# Patient Record
Sex: Female | Born: 1980 | Race: Asian | Hispanic: Yes | Marital: Single | Smoking: Never smoker
Health system: Southern US, Community
[De-identification: ages and names within clinical notes are randomized; demographics above are authoritative.]

## PROBLEM LIST (undated history)

## (undated) DIAGNOSIS — Z789 Other specified health status: Secondary | ICD-10-CM

## (undated) HISTORY — PX: TOOTH EXTRACTION: SUR596

## (undated) HISTORY — PX: DILATION AND CURETTAGE OF UTERUS: SHX78

---

## 2015-12-23 ENCOUNTER — Encounter (HOSPITAL_COMMUNITY): Payer: Self-pay | Admitting: *Deleted

## 2015-12-23 DIAGNOSIS — N3000 Acute cystitis without hematuria: Secondary | ICD-10-CM | POA: Insufficient documentation

## 2015-12-23 LAB — COMPREHENSIVE METABOLIC PANEL
ALBUMIN: 4.1 g/dL (ref 3.5–5.0)
ALT: 30 U/L (ref 14–54)
AST: 27 U/L (ref 15–41)
Alkaline Phosphatase: 64 U/L (ref 38–126)
Anion gap: 10 (ref 5–15)
BILIRUBIN TOTAL: 1.1 mg/dL (ref 0.3–1.2)
BUN: 9 mg/dL (ref 6–20)
CO2: 23 mmol/L (ref 22–32)
CREATININE: 0.86 mg/dL (ref 0.44–1.00)
Calcium: 9.7 mg/dL (ref 8.9–10.3)
Chloride: 103 mmol/L (ref 101–111)
GFR calc Af Amer: >60 mL/min (ref >60)
GLUCOSE: 140 mg/dL — AB (ref 65–99)
POTASSIUM: 3.7 mmol/L (ref 3.5–5.1)
Sodium: 136 mmol/L (ref 135–145)
TOTAL PROTEIN: 7.5 g/dL (ref 6.5–8.1)

## 2015-12-23 LAB — URINALYSIS, ROUTINE W REFLEX MICROSCOPIC
GLUCOSE, UA: NEGATIVE mg/dL
KETONES UR: 15 mg/dL — AB
NITRITE: POSITIVE — AB
PH: 7 (ref 5.0–8.0)
Specific Gravity, Urine: 1.015 (ref 1.005–1.030)

## 2015-12-23 LAB — URINE MICROSCOPIC-ADD ON

## 2015-12-23 LAB — CBC
HEMATOCRIT: 40.4 % (ref 36.0–46.0)
Hemoglobin: 14.2 g/dL (ref 12.0–15.0)
MCH: 29.8 pg (ref 26.0–34.0)
MCHC: 35.1 g/dL (ref 30.0–36.0)
MCV: 84.7 fL (ref 78.0–100.0)
PLATELETS: 319 10*3/uL (ref 150–400)
RBC: 4.77 MIL/uL (ref 3.87–5.11)
RDW: 12.3 % (ref 11.5–15.5)
WBC: 15.9 10*3/uL — ABNORMAL HIGH (ref 4.0–10.5)

## 2015-12-23 LAB — POC URINE PREG, ED: Preg Test, Ur: NEGATIVE

## 2015-12-23 MED ORDER — ONDANSETRON 4 MG PO TBDP
4.0000 mg | ORAL_TABLET | Freq: Once | ORAL | Status: AC | PRN
  Administered 2015-12-23: 4 mg via ORAL

## 2015-12-23 MED ORDER — ONDANSETRON 4 MG PO TBDP
ORAL_TABLET | ORAL | Status: AC
  Filled 2015-12-23: qty 1

## 2015-12-23 NOTE — ED Triage Notes (Signed)
Pt c/o headache, dizziness, and dysuria x 1 week. Reports today pt fainted twice at home, did hit her head. Pt has hx of kidney infections and has been attempting to use AZO otc without relief.

## 2015-12-24 ENCOUNTER — Emergency Department (HOSPITAL_COMMUNITY)
Admission: EM | Admit: 2015-12-24 | Discharge: 2015-12-24 | Disposition: A | Discharge status: Home / Self Care | Payer: Self-pay | Attending: Emergency Medicine | Admitting: Emergency Medicine

## 2015-12-24 DIAGNOSIS — N3000 Acute cystitis without hematuria: Secondary | ICD-10-CM

## 2015-12-24 MED ORDER — IBUPROFEN 800 MG PO TABS
800.0000 mg | ORAL_TABLET | Freq: Once | ORAL | Status: AC
  Administered 2015-12-24: 800 mg via ORAL
  Filled 2015-12-24: qty 1

## 2015-12-24 MED ORDER — CEPHALEXIN 500 MG PO CAPS: 500.0000 mg | ORAL_CAPSULE | Freq: Four times a day (QID) | ORAL | 0 refills | Status: AC

## 2015-12-24 MED ORDER — CEPHALEXIN 250 MG PO CAPS
500.0000 mg | ORAL_CAPSULE | Freq: Once | ORAL | Status: AC
  Administered 2015-12-24: 500 mg via ORAL
  Filled 2015-12-24: qty 2

## 2015-12-24 MED ORDER — ACETAMINOPHEN 500 MG PO TABS
1000.0000 mg | ORAL_TABLET | Freq: Once | ORAL | Status: AC
  Administered 2015-12-24: 1000 mg via ORAL
  Filled 2015-12-24: qty 2

## 2015-12-24 NOTE — ED Provider Notes (Signed)
[facility]-EMERGENCY DEPT Provider Note   CSN: [id number] Arrival date & time: 12/23/15  1955  By signing my name below, I, [name], attest that this documentation has been prepared under the direction and in the presence of Melene Planan Kalinda Romaniello, DO.  Electronically Signed: [name], ED Scribe. 12/24/15. 3:44 AM.    History   Chief Complaint Chief Complaint  Patient presents with  . Abdominal Pain   The history is provided by the patient. A language interpreter was used.    HPI Comments: Rebecca OmsCristhy Bartlett is a 35 y.o. female who presents to the Emergency Department here for multiple complaints this evening. Prior to arrival, pt experienced two separate episodes of syncope this evening. Pt states first episode occurred as she was attempting to get up from her bed, walked a short distance and fainted. Second episode occurred "shortly after the first". She also reports ongoing, intermittent dizziness, HA, back pain, dysuria, and lower abdominal pain. No aggravating or alleviating factors reported. OTC AZO attempted prior to arrived without any relief. No recent nausea, vomiting, or diarrhea. Pt had a history of kidney infections and states symptoms feel similar.  PCP: No primary care provider on file.    History reviewed. No pertinent past medical history.  There are no active problems to display for this patient.   History reviewed. No pertinent surgical history.  OB History    No data available       Home Medications    Prior to Admission medications   Medication Sig Start Date End Date Taking? Authorizing Provider  cephALEXin (KEFLEX) 500 MG capsule Take 1 capsule (500 mg total) by mouth 4 (four) times daily. 12/24/15   Melene Planan Jennah Satchell, DO    Family History History reviewed. No pertinent family history.  Social History Social History  Substance Use Topics  . Smoking status: Never Smoker  . Smokeless tobacco: Never Used  . Alcohol use No     Allergies   Review of  patient's allergies indicates no known allergies.   Review of Systems Review of Systems  Constitutional: Positive for fever. Negative for chills.  HENT: Negative for congestion and rhinorrhea.   Eyes: Negative for redness and visual disturbance.  Respiratory: Negative for shortness of breath and wheezing.   Cardiovascular: Negative for chest pain and palpitations.  Gastrointestinal: Positive for abdominal pain. Negative for diarrhea, nausea and vomiting.  Genitourinary: Positive for dysuria. Negative for urgency.  Musculoskeletal: Positive for back pain. Negative for arthralgias and myalgias.  Skin: Negative for pallor and wound.  Neurological: Positive for dizziness, syncope and headaches.  All other systems reviewed and are negative.    Physical Exam Updated Vital Signs BP 105/60 (BP Location: Left Arm)   Pulse 84   Temp 98.9 F (37.2 C) (Oral)   Resp 16   LMP 12/13/2015   SpO2 98%   Physical Exam  Constitutional: She is oriented to person, place, and time. She appears well-developed and well-nourished. No distress.  HENT:  Head: Normocephalic and atraumatic.  Eyes: EOM are normal. Pupils are equal, round, and reactive to light.  Neck: Normal range of motion. Neck supple.  Cardiovascular: Normal rate and regular rhythm.  Exam reveals no gallop and no friction rub.   No murmur heard. Pulmonary/Chest: Effort normal. She has no wheezes. She has no rales.  Diffuse mild L sided abdominal tenderness.  Abdominal: Soft. She exhibits no distension. There is tenderness.  Genitourinary:  Genitourinary Comments: Mild L sided CVA tenderness.  Musculoskeletal: She exhibits  no edema or tenderness.  Neurological: She is alert and oriented to person, place, and time.  Skin: Skin is warm and dry. She is not diaphoretic.  Psychiatric: She has a normal mood and affect. Her behavior is normal.  Nursing note and vitals reviewed.    ED Treatments / Results   DIAGNOSTIC STUDIES: Oxygen  Saturation is 98% on RA, Normal by my interpretation.    COORDINATION OF CARE: 3:35 AM- Will give Zofran. Will order blood work, EKG, and urinalysis. Discussed treatment plan with pt at bedside and pt agreed to plan.     Labs (all labs ordered are listed, but only abnormal results are displayed) Labs Reviewed  COMPREHENSIVE METABOLIC PANEL - Abnormal; Notable for the following:       Result Value   Glucose, Bld 140 (*)    All other components within normal limits  CBC - Abnormal; Notable for the following:    WBC 15.9 (*)    All other components within normal limits  URINALYSIS, ROUTINE W REFLEX MICROSCOPIC (NOT AT [facility]) - Abnormal; Notable for the following:    Color, Urine ORANGE (*)    APPearance TURBID (*)    Hgb urine dipstick MODERATE (*)    Bilirubin Urine SMALL (*)    Ketones, ur 15 (*)    Protein, ur >300 (*)    Nitrite POSITIVE (*)    Leukocytes, UA LARGE (*)    All other components within normal limits  URINE MICROSCOPIC-ADD ON - Abnormal; Notable for the following:    Squamous Epithelial / LPF 0-5 (*)    Bacteria, UA MANY (*)    All other components within normal limits  POC URINE PREG, ED    EKG  EKG Interpretation  Date/Time:  Sunday December 23 2015 20:22:21 EDT Ventricular Rate:  117 PR Interval:  124 QRS Duration: 72 QT Interval:  296 QTC Calculation: 412 R Axis:   85 Text Interpretation:   Poor data quality, interpretation may be adversely affected Sinus tachycardia Nonspecific T wave abnormality Abnormal ECG flipped t wave in lead v3 Otherwise no significant change Confirmed by Karlton Maya MD, DANIEL ([id number]) on 12/24/2015 2:45:50 AM       Radiology No results found.  Procedures Procedures (including critical care time)  Medications Ordered in ED Medications  acetaminophen (TYLENOL) tablet 1,000 mg (not administered)  ibuprofen (ADVIL,MOTRIN) tablet 800 mg (not administered)  cephALEXin (KEFLEX) capsule 500 mg (not administered)  ondansetron  (ZOFRAN-ODT) disintegrating tablet 4 mg (4 mg Oral Given 12/23/15 2017)     Initial Impression / Assessment and Plan / ED Course  I have reviewed the triage vital signs and the nursing notes.  Pertinent labs & imaging results that were available during my care of the patient were reviewed by me and considered in my medical decision making (see chart for details).  Clinical Course    35 yo F With a chief complaints of diffuse abdominal pain flank pain fevers and chills. This been going on for the past few days. She tried to go to the hospital today and she passed out twice. Sounds like the patient is orthostatic based on her description. She denies any vomiting or diarrhea. She had labs that do not show a anemia. She does have a leukocytosis and a urinary tract infection. Will treat her for possible pyelonephritis with Keflex. PCP follow-up.  3:50 AM:  I have discussed the diagnosis/risks/treatment options with the patient and family and believe the pt to be eligible for discharge home  to follow-up with PCP. We also discussed returning to the ED immediately if new or worsening sx occur. We discussed the sx which are most concerning (e.g., sudden worsening pain, fever, inability to tolerate by mouth) that necessitate immediate return. Medications administered to the patient during their visit and any new prescriptions provided to the patient are listed below.  Medications given during this visit Medications  acetaminophen (TYLENOL) tablet 1,000 mg (not administered)  ibuprofen (ADVIL,MOTRIN) tablet 800 mg (not administered)  cephALEXin (KEFLEX) capsule 500 mg (not administered)  ondansetron (ZOFRAN-ODT) disintegrating tablet 4 mg (4 mg Oral Given 12/23/15 2017)     The patient appears reasonably screen and/or stabilized for discharge and I doubt any other medical condition or other [facility] requiring further screening, evaluation, or treatment in the ED at this time prior to discharge.    Final  Clinical Impressions(s) / ED Diagnoses   Final diagnoses:  Acute cystitis without hematuria    New Prescriptions New Prescriptions   CEPHALEXIN (KEFLEX) 500 MG CAPSULE    Take 1 capsule (500 mg total) by mouth 4 (four) times daily.   I personally performed the services described in this documentation, which was scribed in my presence. The recorded information has been reviewed and is accurate.     Melene Plan, DO 12/24/15 [phone]

## 2017-07-29 ENCOUNTER — Encounter (HOSPITAL_COMMUNITY): Payer: Self-pay

## 2017-07-29 ENCOUNTER — Other Ambulatory Visit (HOSPITAL_COMMUNITY): Payer: Self-pay | Admitting: Nurse Practitioner

## 2017-07-29 DIAGNOSIS — Z369 Encounter for antenatal screening, unspecified: Secondary | ICD-10-CM

## 2017-07-31 ENCOUNTER — Encounter (HOSPITAL_COMMUNITY): Payer: Self-pay

## 2017-07-31 ENCOUNTER — Other Ambulatory Visit (HOSPITAL_COMMUNITY): Payer: Self-pay | Admitting: Nurse Practitioner

## 2017-07-31 ENCOUNTER — Ambulatory Visit (HOSPITAL_COMMUNITY)
Admission: RE | Admit: 2017-07-31 | Discharge: 2017-07-31 | Disposition: A | Discharge status: Home / Self Care | Payer: Self-pay | Source: Ambulatory Visit | Attending: Nurse Practitioner | Admitting: Nurse Practitioner

## 2017-07-31 ENCOUNTER — Ambulatory Visit (HOSPITAL_COMMUNITY)
Admission: RE | Admit: 2017-07-31 | Discharge: 2017-07-31 | Disposition: A | Discharge status: Home / Self Care | Payer: Self-pay | Source: Ambulatory Visit | Attending: Obstetrics & Gynecology | Admitting: Obstetrics & Gynecology

## 2017-07-31 DIAGNOSIS — Z3A13 13 weeks gestation of pregnancy: Secondary | ICD-10-CM | POA: Insufficient documentation

## 2017-07-31 DIAGNOSIS — Z3682 Encounter for antenatal screening for nuchal translucency: Secondary | ICD-10-CM | POA: Insufficient documentation

## 2017-07-31 DIAGNOSIS — O09521 Supervision of elderly multigravida, first trimester: Secondary | ICD-10-CM | POA: Insufficient documentation

## 2017-07-31 DIAGNOSIS — Z369 Encounter for antenatal screening, unspecified: Secondary | ICD-10-CM

## 2017-07-31 NOTE — ED Notes (Signed)
[address] [place], #[id number]

## 2017-08-06 ENCOUNTER — Other Ambulatory Visit: Payer: Self-pay

## 2017-08-07 ENCOUNTER — Other Ambulatory Visit (HOSPITAL_COMMUNITY): Payer: Self-pay | Admitting: Family

## 2017-08-07 DIAGNOSIS — O09522 Supervision of elderly multigravida, second trimester: Secondary | ICD-10-CM

## 2017-08-07 DIAGNOSIS — Z3689 Encounter for other specified antenatal screening: Secondary | ICD-10-CM

## 2017-08-26 ENCOUNTER — Encounter (HOSPITAL_COMMUNITY): Payer: Self-pay

## 2017-09-04 ENCOUNTER — Ambulatory Visit (HOSPITAL_COMMUNITY)
Admission: RE | Admit: 2017-09-04 | Discharge: 2017-09-04 | Disposition: A | Discharge status: Home / Self Care | Payer: Self-pay | Source: Ambulatory Visit | Attending: Family | Admitting: Family

## 2017-09-04 ENCOUNTER — Encounter (HOSPITAL_COMMUNITY): Payer: Self-pay

## 2017-09-04 DIAGNOSIS — Z3A18 18 weeks gestation of pregnancy: Secondary | ICD-10-CM | POA: Insufficient documentation

## 2017-09-04 DIAGNOSIS — Z3689 Encounter for other specified antenatal screening: Secondary | ICD-10-CM

## 2017-09-04 DIAGNOSIS — O09522 Supervision of elderly multigravida, second trimester: Secondary | ICD-10-CM | POA: Insufficient documentation

## 2017-09-04 DIAGNOSIS — O321XX0 Maternal care for breech presentation, not applicable or unspecified: Secondary | ICD-10-CM | POA: Insufficient documentation

## 2017-09-04 DIAGNOSIS — Z36 Encounter for antenatal screening for chromosomal anomalies: Secondary | ICD-10-CM | POA: Insufficient documentation

## 2017-09-04 DIAGNOSIS — O09529 Supervision of elderly multigravida, unspecified trimester: Secondary | ICD-10-CM

## 2017-09-04 DIAGNOSIS — Z8279 Family history of other congenital malformations, deformations and chromosomal abnormalities: Secondary | ICD-10-CM | POA: Insufficient documentation

## 2017-09-04 HISTORY — DX: Other specified health status: Z78.9

## 2017-09-04 NOTE — Progress Notes (Signed)
Genetic Counseling  High-Risk Gestation Note  Appointment Date:  09/04/2017 Referred By: Rebecca Bartlett, F* Date of Birth:  1980-04-15 Partner:  Rebecca Bartlett   Pregnancy History: [id number] Estimated Date of Delivery: 01/31/18 Estimated Gestational Age: 7746w5d Attending: Darlyn ReadEmily Bunce, MD   Ms. Rebecca Bartlett was seen for genetic counseling because of a maternal age of 37 years old at delivery and history of chromosome finding in previous pregnancy.     In summary:  Discussed AMA and associated risk for fetal aneuploidy  Reviewed previous screening- First trimester screen within normal limits  Discussed options for screening  NIPS- declined  Ultrasound- within normal limits today  Discussed diagnostic testing options  Amniocentesis- declined  Maternal Karyotype given possible chromosome abnormality in previous pregnancy- declined  Reviewed family history concerns  Possible chromosome abnormality in previous pregnancy loss  Reviewed limitations in assessing recurrence risk without additional information  Both family histories were reviewed and found to be contributory for spontaneous pregnancy loss for the couple'Bartlett first pregnancy. The patient reported that she was approximately [redacted] weeks gestation and that the embryo did not completely develop. The patient said that she was told that "they couldn't find a chromosome but it was from the father," indicating that a chromosome difference was either detected or suspected. Records were not available to us today, as the patient received care in [place] during this pregnancy. Without further information regarding the provided family history, an accurate genetic risk cannot be calculated. Further genetic counseling is warranted if more information is obtained.  In general, we spent time reviewing chromosomes and examples of chromosome conditions. We discussed that chromosome imbalances can occur sporadically or can be inherited  from a parent with a chromosome imbalance or chromosome rearrangement. Without medical documentation of the specific chromosome abnormality, we are unable to assess the mechanism by which it may have occurred and the recurrence risk for current and future pregnancies. We discussed options of karyotype analysis by amniocentesis in the current pregnancy as well as maternal karyotype analysis for the patient to assess for possible chromosome rearrangement. Risks, benefits, and limitations of each were reviewed. Ms. Rebecca Bartlett declined karyotype analysis for herself and by amniocentesis at this time.   She was counseled regarding maternal age and the association with risk for chromosome conditions due to nondisjunction with aging of the ova.  We reviewed chromosomes, nondisjunction, and the associated risk for fetal aneuploidy at 4846w5d related to a maternal age of 37 years old at delivery.  She was counseled that the risk for aneuploidy decreases as gestational age increases, accounting for those pregnancies which spontaneously abort.  We specifically discussed Down syndrome (trisomy 9821), trisomies 5413 and 6118, and sex chromosome aneuploidies (47,XXX and 47,XXY) including the common features and prognoses of each.   We reviewed available screening options including First Screen, noninvasive prenatal screening (NIPS)/cell free DNA (cfDNA) screening, and detailed ultrasound.  She was counseled that screening tests are used to modify a patient'Bartlett a priori risk for aneuploidy, typically based on age. This estimate provides a pregnancy specific risk assessment. We reviewed the benefits and limitations of each option. Specifically, we discussed the conditions for which each test screens, the detection rates, and false positive rates of each. Ms. Rebecca Bartlett previously had first trimester screening, which was within normal limits for the conditions screened. She was also counseled regarding diagnostic testing via amniocentesis. We  reviewed the approximate 1 in 300-500 risk for complications from amniocentesis, including spontaneous pregnancy loss. We discussed the possible results  that the tests might provide including: positive, negative, unanticipated, and no result. Finally, they were counseled regarding the cost of each option and potential out of pocket expenses. Ms. Rebecca Bartlett elected ultrasound only and declined NIPS and amniocentesis.   A complete ultrasound was performed today. The ultrasound report will be sent under separate cover. There were no visualized fetal anomalies or markers suggestive of aneuploidy. Diagnostic testing was declined today.  She understands that screening tests cannot rule out all birth defects or genetic syndromes. The patient was advised of this limitation and states she still does not want additional testing at this time.   Ms. Rebecca Bartlett was provided with written information regarding cystic fibrosis (CF), spinal muscular atrophy (SMA) and hemoglobinopathies including the carrier frequency, availability of carrier screening and prenatal diagnosis if indicated.  In addition, we discussed that CF and hemoglobinopathies are routinely screened for as part of the [place] newborn screening panel. She declined screening for CF, SMA and hemoglobinopathies.  Ms. Rebecca Bartlett denied exposure to environmental toxins or chemical agents. She denied the use of alcohol, tobacco or street drugs. She denied significant viral illnesses during the course of her pregnancy.  I counseled Ms. Rebecca Bartlett regarding the above risks and available options.  The approximate face-to-face time with the genetic counselor was 40 minutes.  Rebecca Plowman, MS,  Certified [employer] 09/04/2017

## 2018-04-25 ENCOUNTER — Encounter (HOSPITAL_COMMUNITY): Payer: Self-pay

## 2019-11-19 IMAGING — US US MFM OB DETAIL+14 WK
1 series · 14 of 28 positions shown · non-contrast
Comparison: none

[Series 1: us mfm ob detail+14 wk · 118 acquisitions, 14 frames shown]
[im 5/118]
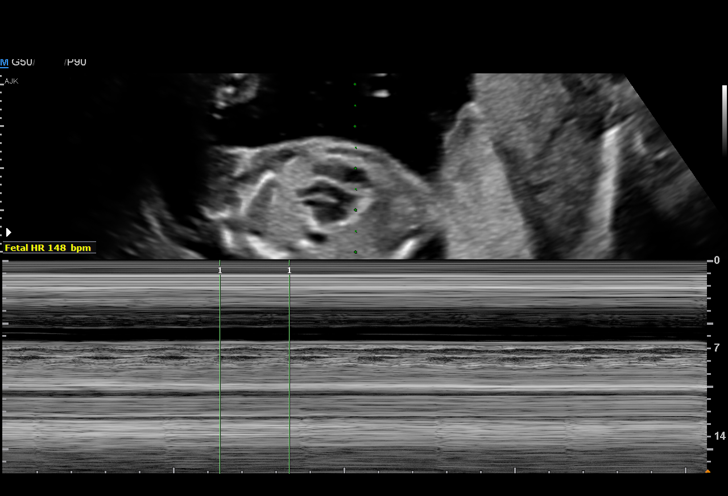
[im 14/118]
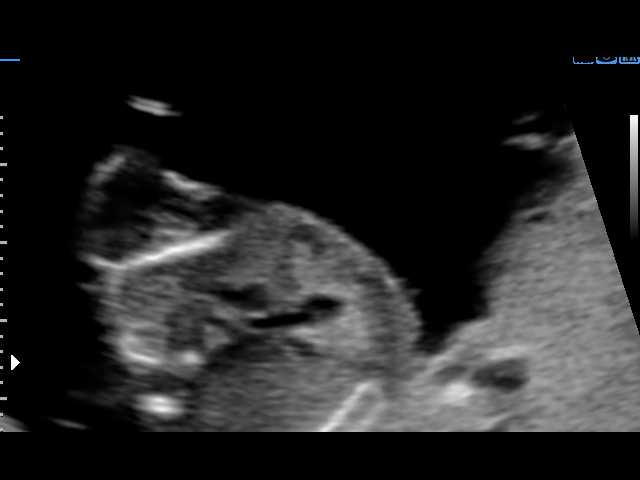
[im 22/118]
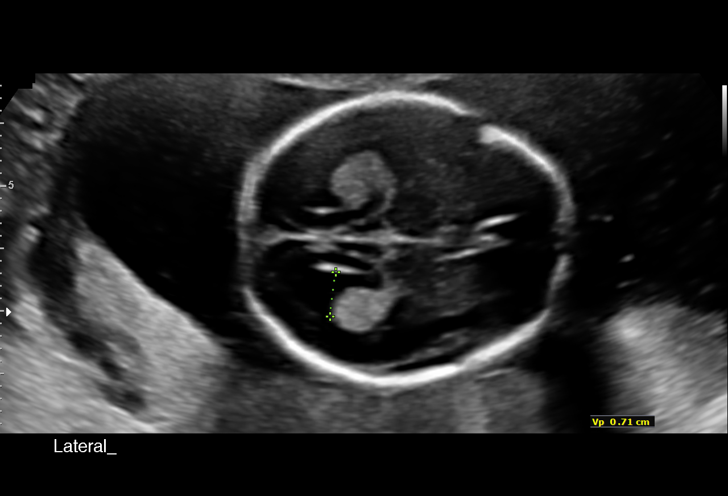
[im 31/118]
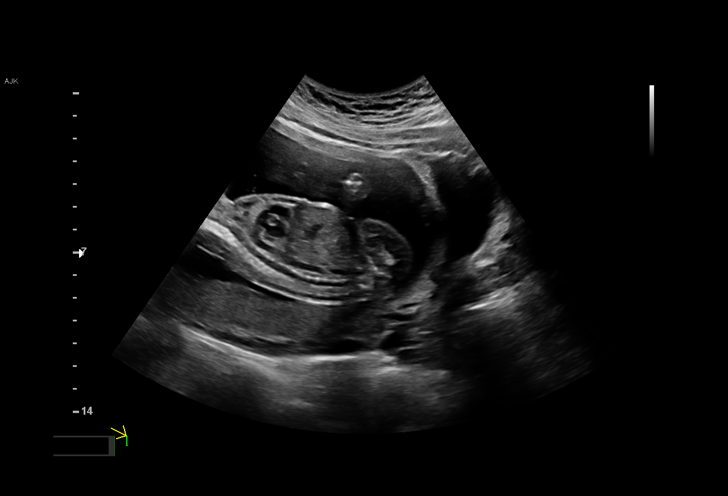
[im 40/118]
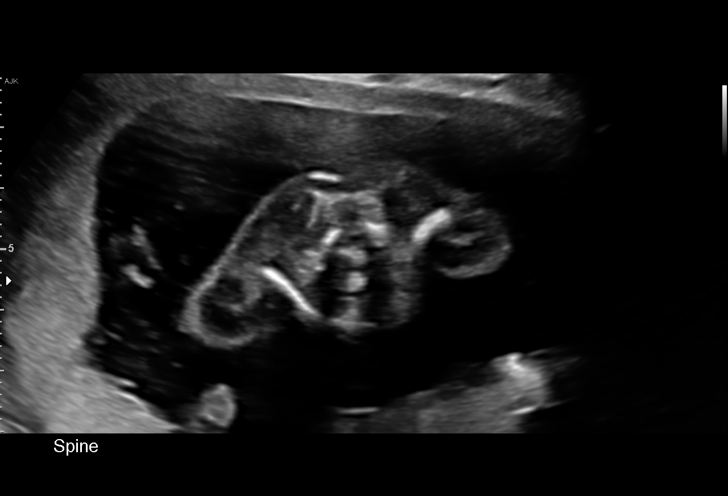
[im 48/118]
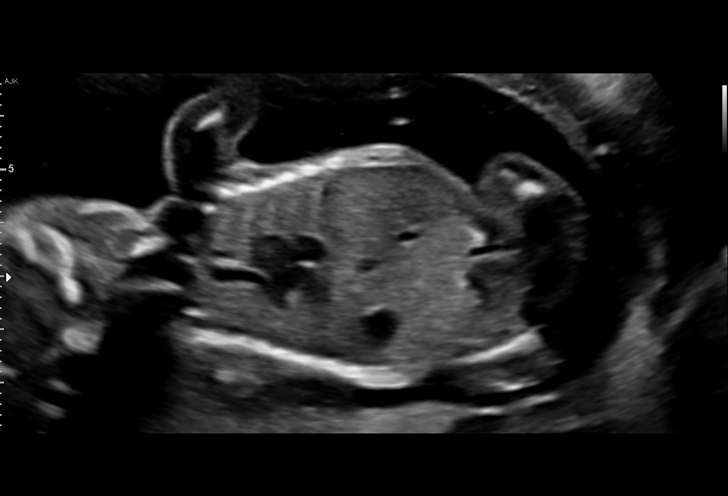
[im 57/118]
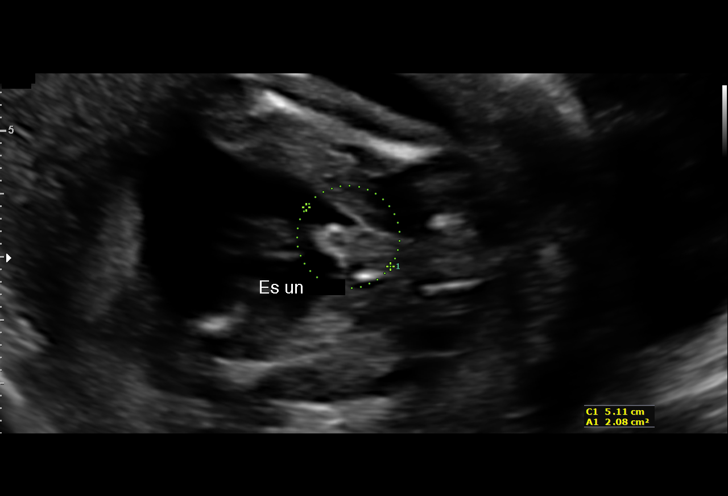
[im 66/118]
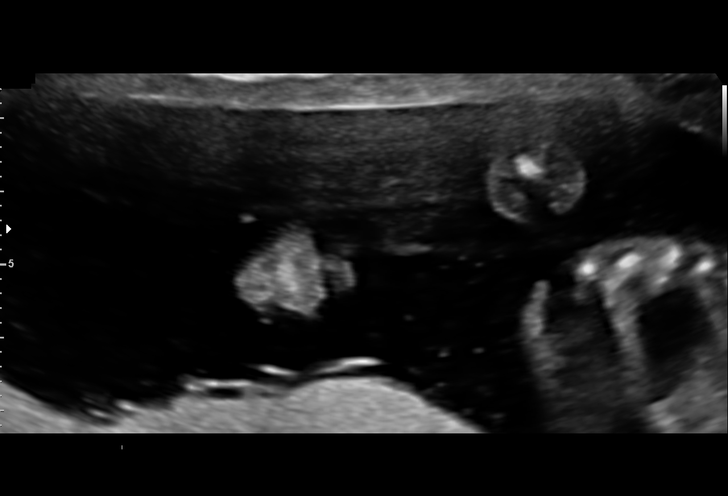
[im 74/118]
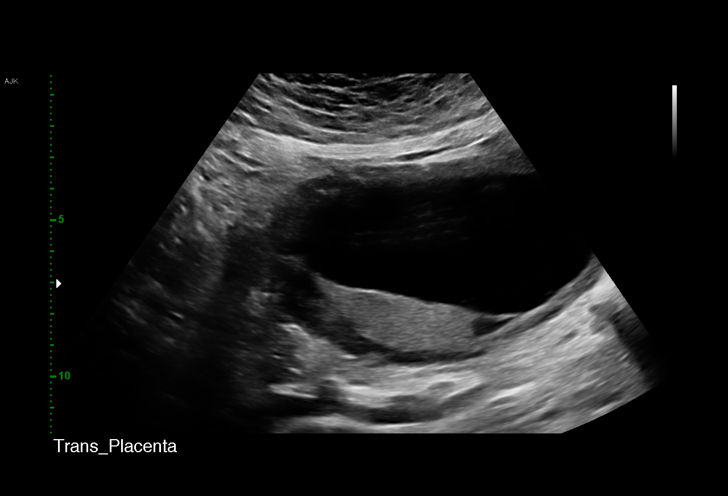
[im 83/118]
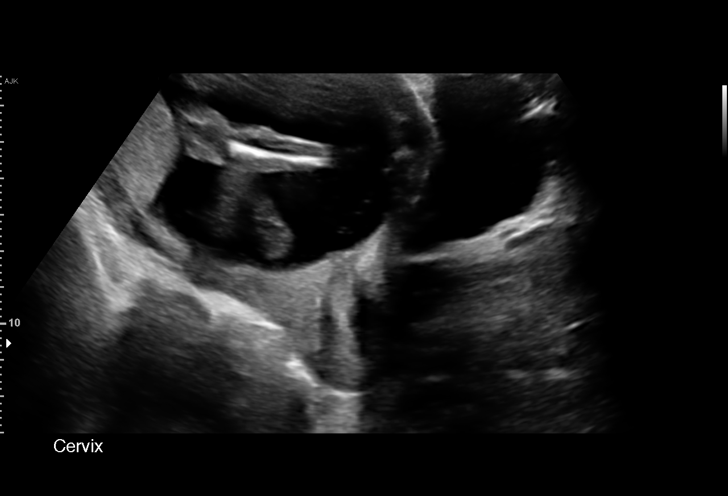
[im 92/118]
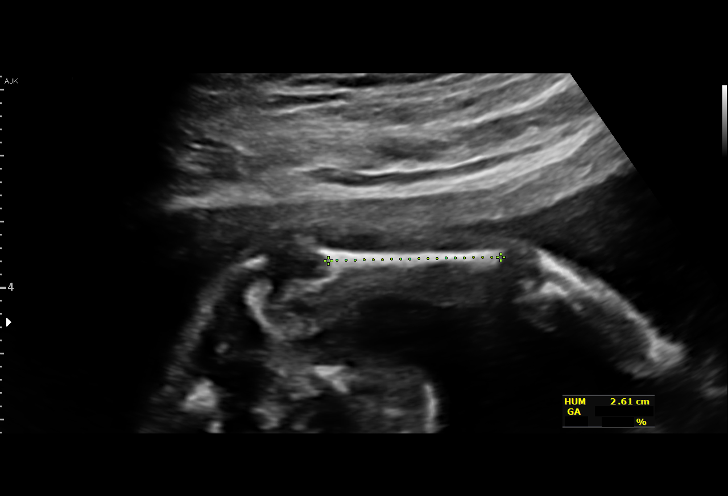
[im 100/118]
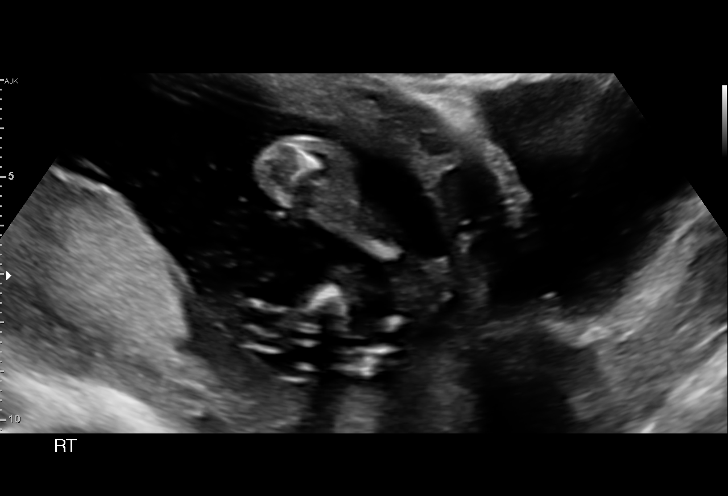
[im 109/118]
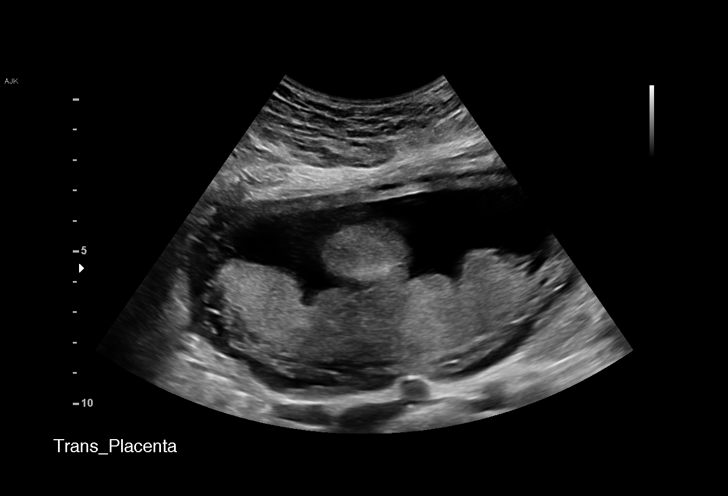
[im 118/118]
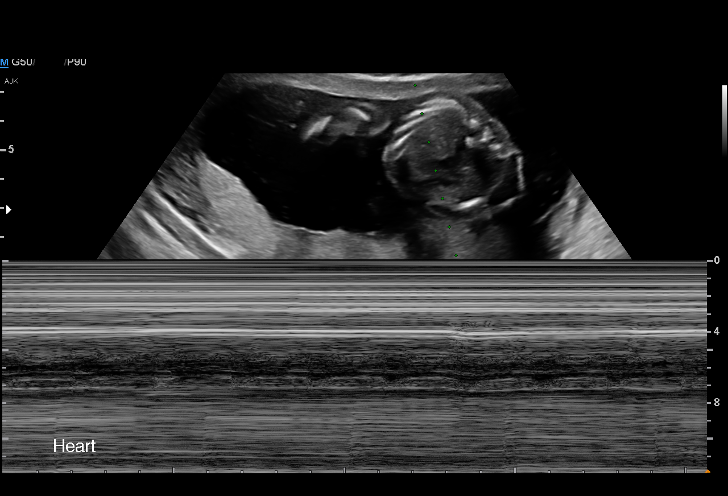

[14 of 28 positions shown; findings below may reference images not displayed]

Health
GZ NP

1  PINDER                [id number]      [id number]     [id number]
JUKIA
Indications

18 weeks gestation of pregnancy
Encounter for antenatal screening for
malformations
Advanced maternal age multigravida 35+,
second trimester (NML 1st trimester
screening)
OB History

Gravidity:    2          SAB:   1
Living:       0
Fetal Evaluation

Num Of Fetuses:     1
Fetal Heart         148
Rate(bpm):
Cardiac Activity:   Observed
Presentation:       Breech
Placenta:           Posterior, above cervical os
P. Cord Insertion:  Visualized, central

Amniotic Fluid
AFI FV:      Subjectively within normal limits

Largest Pocket(cm)
5.62
Biometry

BPD:      45.8  mm     G. Age:  19w 6d         90  %    CI:        79.64   %    70 - 86
FL/HC:      17.2   %    16.1 -
HC:      162.2  mm     G. Age:  19w 0d         57  %    HC/AC:      1.08        1.09 -
AC:      150.2  mm     G. Age:  20w 2d         90  %    FL/BPD:     60.9   %
FL:       27.9  mm     G. Age:  18w 4d         38  %    FL/AC:      18.6   %    20 - 24
HUM:      26.3  mm     G. Age:  18w 2d         41  %
CER:      19.5  mm     G. Age:  18w 5d         51  %
NFT:       5.5  mm

CM:        4.6  mm

Est. FW:     292  gm    0 lb 10 oz      57  %
Gestational Age

LMP:           18w 5d        Date:  04/26/17                 EDD:   01/31/18
U/S Today:     19w 3d                                        EDD:   01/26/18
Best:          18w 5d     Det. By:  LMP  (04/26/17)          EDD:   01/31/18
Anatomy

Cranium:               Appears normal         Aortic Arch:            Appears normal
Cavum:                 Appears normal         Ductal Arch:            Appears normal
Ventricles:            Appears normal         Diaphragm:              Appears normal
Choroid Plexus:        Appears normal         Stomach:                Appears normal, left
sided
Cerebellum:            Appears normal         Abdomen:                Appears normal
Posterior Fossa:       Appears normal         Abdominal Wall:         Appears nml (cord
insert, abd wall)
Nuchal Fold:           Appears normal         Cord Vessels:           Appears normal (3
vessel cord)
Face:                  Appears normal         Kidneys:                Appear normal
(orbits and profile)
Lips:                  Appears normal         Bladder:                Appears normal
Thoracic:              Appears normal         Spine:                  Appears normal
Heart:                 Appears normal         Upper Extremities:      Appears normal
(4CH, axis, and
situs)
RVOT:                  Appears normal         Lower Extremities:      Appears normal
LVOT:                  Appears normal

Other:  Fetus appears to be a male. Heels and 5th digit visualized. Nasal
bone visualized.
Cervix Uterus Adnexa

Cervix
Length:           3.17  cm.
Normal appearance by transabdominal scan.

Uterus
No abnormality visualized.

Left Ovary
Within normal limits.

Right Ovary
Not visualized.

Adnexa:       No abnormality visualized.
Comments

Genetic counseling following US today, see separate consult
note.
Impression

Single living intrauterine pregnancy at 18w 5d.
Breech presentation.
Placenta Posterior, above cervical os.
Appropriate fetal growth.
Normal amniotic fluid volume.
The fetal anatomic survey is complete.
Normal fetal anatomy.
No fetal anomalies or soft markers of aneuploidy seen.
The adnexa appear normal bilaterally without masses.
The cervix measures 3.17cm on transabdominal imaging
without funneling.
Recommendations

Follow-up ultrasounds as clinically indicated.
